# Patient Record
Sex: Female | Born: 1980 | Race: Black or African American | Hispanic: No | Marital: Single | Smoking: Never smoker
Health system: Southern US, Community
[De-identification: ages and names within clinical notes are randomized; demographics above are authoritative.]

---

## 2013-04-07 ENCOUNTER — Encounter (HOSPITAL_COMMUNITY): Payer: Self-pay | Admitting: Emergency Medicine

## 2013-04-07 ENCOUNTER — Emergency Department (HOSPITAL_COMMUNITY): Payer: PRIVATE HEALTH INSURANCE

## 2013-04-07 ENCOUNTER — Emergency Department (HOSPITAL_COMMUNITY)
Admission: EM | Admit: 2013-04-07 | Discharge: 2013-04-08 | Disposition: A | Discharge status: Home / Self Care | Payer: PRIVATE HEALTH INSURANCE | Attending: Emergency Medicine | Admitting: Emergency Medicine

## 2013-04-07 DIAGNOSIS — W108XXA Fall (on) (from) other stairs and steps, initial encounter: Secondary | ICD-10-CM | POA: Insufficient documentation

## 2013-04-07 DIAGNOSIS — Z3202 Encounter for pregnancy test, result negative: Secondary | ICD-10-CM | POA: Insufficient documentation

## 2013-04-07 DIAGNOSIS — Y929 Unspecified place or not applicable: Secondary | ICD-10-CM | POA: Insufficient documentation

## 2013-04-07 DIAGNOSIS — S43016A Anterior dislocation of unspecified humerus, initial encounter: Secondary | ICD-10-CM | POA: Insufficient documentation

## 2013-04-07 DIAGNOSIS — Y9389 Activity, other specified: Secondary | ICD-10-CM | POA: Insufficient documentation

## 2013-04-07 LAB — POCT PREGNANCY, URINE: PREG TEST UR: NEGATIVE

## 2013-04-07 MED ORDER — ONDANSETRON 4 MG PO TBDP
4.0000 mg | ORAL_TABLET | Freq: Once | ORAL | Status: AC
  Administered 2013-04-07: 4 mg via ORAL
  Filled 2013-04-07: qty 1

## 2013-04-07 MED ORDER — DIAZEPAM 5 MG PO TABS
5.0000 mg | ORAL_TABLET | Freq: Once | ORAL | Status: AC
  Administered 2013-04-07: 5 mg via ORAL
  Filled 2013-04-07: qty 1

## 2013-04-07 MED ORDER — FENTANYL CITRATE 0.05 MG/ML IJ SOLN
50.0000 ug | Freq: Once | INTRAMUSCULAR | Status: DC
  Filled 2013-04-07: qty 2

## 2013-04-07 MED ORDER — SODIUM CHLORIDE 0.9 % IV SOLN
Freq: Once | INTRAVENOUS | Status: AC
  Administered 2013-04-07: 125 mL/h via INTRAVENOUS

## 2013-04-07 MED ORDER — ETOMIDATE 2 MG/ML IV SOLN
0.2000 mg/kg | Freq: Once | INTRAVENOUS | Status: AC
  Administered 2013-04-08: 15.6 mg via INTRAVENOUS
  Filled 2013-04-07: qty 10

## 2013-04-07 MED ORDER — OXYCODONE-ACETAMINOPHEN 5-325 MG PO TABS
2.0000 | ORAL_TABLET | Freq: Once | ORAL | Status: AC
  Administered 2013-04-07: 2 via ORAL
  Filled 2013-04-07: qty 2

## 2013-04-07 MED ORDER — ETOMIDATE 2 MG/ML IV SOLN: 0.2500 mg/kg | Freq: Once | INTRAVENOUS | Status: DC

## 2013-04-07 MED ORDER — ONDANSETRON HCL 4 MG/2ML IJ SOLN
4.0000 mg | Freq: Once | INTRAMUSCULAR | Status: DC
  Filled 2013-04-07: qty 2

## 2013-04-07 NOTE — ED Provider Notes (Signed)
CSN: [id number]     Arrival date & time 04/07/13  2154 History   First MD Initiated Contact with Patient 04/07/13 2216     Chief Complaint  Patient presents with  . Shoulder Pain   (Consider location/radiation/quality/duration/timing/severity/associated sxs/prior Treatment) HPI Comments: Patient is a 33 year old female who presents to the emergency apartment with complaint of left shoulder pain. The patient states she was coming down steps this evening just prior to coming to the emergency department, she slipped, fell, and injured the shoulder. She states that since the fall she has not been able to move her shoulder without a great deal of pain. She's not had any previous operations or procedures on the left upper extremity. She denies being on any blood thinning type medications. She denies any other injuries other than the left shoulder.  Patient is a 33 y.o. female presenting with shoulder pain. The history is provided by the patient.  Shoulder Pain This is a new problem. The current episode started today. Pertinent negatives include no abdominal pain, arthralgias, chest pain, coughing or neck pain.    History reviewed. No pertinent past medical history. History reviewed. No pertinent past surgical history. History reviewed. No pertinent family history. History  Substance Use Topics  . Smoking status: Never Smoker   . Smokeless tobacco: Not on file  . Alcohol Use: No   OB History   Grav Para Term Preterm Abortions TAB SAB Ect Mult Living                 Review of Systems  Constitutional: Negative for activity change.       All ROS Neg except as noted in HPI  HENT: Negative for nosebleeds.   Eyes: Negative for photophobia and discharge.  Respiratory: Negative for cough, shortness of breath and wheezing.   Cardiovascular: Negative for chest pain and palpitations.  Gastrointestinal: Negative for abdominal pain and blood in stool.  Genitourinary: Negative for dysuria, frequency  and hematuria.  Musculoskeletal: Negative for arthralgias, back pain and neck pain.  Skin: Negative.   Neurological: Negative for dizziness, seizures and speech difficulty.  Psychiatric/Behavioral: Negative for hallucinations and confusion.    Allergies  Review of patient's allergies indicates no known allergies.  Home Medications  No current outpatient prescriptions on file. BP 124/70  Pulse 86  Temp(Src) 98 F (36.7 C) (Oral)  Resp 18  Ht 5\' 6"  (1.676 m)  Wt 172 lb (78.019 kg)  BMI 27.77 kg/m2  SpO2 100%  LMP 03/24/2013 Physical Exam  Nursing note and vitals reviewed. Constitutional: She is oriented to person, place, and time. She appears well-developed and well-nourished.  Non-toxic appearance.  HENT:  Head: Normocephalic.  Right Ear: Tympanic membrane and external ear normal.  Left Ear: Tympanic membrane and external ear normal.  Eyes: EOM and lids are normal. Pupils are equal, round, and reactive to light.  Neck: Normal range of motion. Neck supple. Carotid bruit is not present.  Cardiovascular: Normal rate, regular rhythm, normal heart sounds, intact distal pulses and normal pulses.   Pulmonary/Chest: Breath sounds normal. No respiratory distress.  No rib or chest wall pain. There is a palpable deformity consistent with the humeral head at the upper left chest just under the clavicle.  Abdominal: Soft. Bowel sounds are normal. There is no tenderness. There is no guarding.  Musculoskeletal: Normal range of motion.  There is full range of motion of the right shoulder, elbow, wrist, fingers.  There is resistance to movement of the left upper extremity.  Particular shoulder area. The patient is holding the shoulder in mild to moderate abduction. On palpation there is loss of the anatomic alignment of the humeral head. The distal pulses are 2+ bilaterally. The capillary refill is less than 2 seconds bilaterally.  Lymphadenopathy:       Head (right side): No submandibular  adenopathy present.       Head (left side): No submandibular adenopathy present.    She has no cervical adenopathy.  Neurological: She is alert and oriented to person, place, and time. She has normal strength. No cranial nerve deficit or sensory deficit.  Skin: Skin is warm and dry.  Psychiatric: She has a normal mood and affect. Her speech is normal.    ED Course  Reduction of dislocation Date/Time: 04/08/2013 12:59 AM Performed by: Kathie DikeBRYANT, Ariaunna Longsworth M Authorized by: Kathie DikeBRYANT, Jionni Helming M Consent: Verbal consent obtained. written consent obtained. Risks and benefits: risks, benefits and alternatives were discussed Consent given by: patient Patient understanding: patient states understanding of the procedure being performed Patient consent: the patient's understanding of the procedure matches consent given Relevant documents: relevant documents present and verified Imaging studies: imaging studies available Patient identity confirmed: arm band Time out: Immediately prior to procedure a "time out" was called to verify the correct patient, procedure, equipment, support staff and site/side marked as required. Patient sedated: yes (Sedation by Dr Patria Maneampos) Patient tolerance: Patient tolerated the procedure well with no immediate complications. Comments: Post reduction films reveal good alignment of the left shoulder.   (including critical care time) Labs Review Labs Reviewed  POCT PREGNANCY, URINE   Imaging Review No results found.  EKG Interpretation   None       MDM Pt sustained a fall down steps tonight and injured the left shoulder. No previous shoulder injuries. No other injuries. No neurovascular compromise. Exam consistent with anterior dislocation. Xray pending to evaluate for fracture.  No diagnosis found. **I have reviewed nursing notes, vital signs, and all appropriate lab and imaging results for this patient.* Xray of the left shoulder reveals anterior dislocation.Pt sent to  the acute area for conscious sedation. Pt seen with me by Dr Patria Maneampos. Patient had conscious sedation by Dr. Patria Maneampos. The shoulder was reduced by me. After the reduction the patient was placed in a shoulder immobilizer, a portable x-ray of the shoulder was obtained, and reveals good postreduction status. Recheck reveals the radial pulses to be 2+, the capillary refill is less than 2 seconds.  The patient will be placed on ibuprofen 800 mg 3 times daily, Norco 5 mg every 4 hours as needed for pain and discomfort. Patient is to followup with Dr. Hilda LiasKeeling if not improving.  Kathie DikeHobson M Thula Stewart, PA-C 04/08/13 0102

## 2013-04-07 NOTE — ED Notes (Signed)
Report given to V. Rice, RN. Pt transferred to room 6.

## 2013-04-07 NOTE — ED Notes (Signed)
Pt reporting pain in left shoulder after falling down stairs and landing on it.  Pt able to move arm, but reporting decreased ROM due to pain.

## 2013-04-07 NOTE — ED Notes (Signed)
Dr. Patria Maneampos at bedside evaluating patient -

## 2013-04-08 ENCOUNTER — Emergency Department (HOSPITAL_COMMUNITY): Payer: PRIVATE HEALTH INSURANCE

## 2013-04-08 MED ORDER — IBUPROFEN 800 MG PO TABS: 800.0000 mg | ORAL_TABLET | Freq: Three times a day (TID) | ORAL | Status: AC

## 2013-04-08 MED ORDER — OXYCODONE-ACETAMINOPHEN 5-325 MG PO TABS: 1.0000 | ORAL_TABLET | Freq: Four times a day (QID) | ORAL | Status: AC | PRN

## 2013-04-08 NOTE — ED Provider Notes (Addendum)
Medical screening examination/treatment/procedure(s) were conducted as a shared visit with non-physician practitioner(s) and myself.  I personally evaluated the patient during the encounter.  EKG Interpretation   None      Left shoulder reduction.  I was personally present and perform the sedation while my physician assistant perform the reduction.  I personally supervised the reduction.  Procedural sedation Performed by: Lyanne CoAMPOS,Triston Skare M Consent: Verbal consent obtained. Risks and benefits: risks, benefits and alternatives were discussed Required items: required blood products, implants, devices, and special equipment available Patient identity confirmed: arm band and provided demographic data Time out: Immediately prior to procedure a "time out" was called to verify the correct patient, procedure, equipment, support staff and site/side marked as required. Sedation type: moderate (conscious) sedation NPO time confirmed and considedered Sedatives: ETOMIDATE Physician Time at Bedside: 15 Vitals: Vital signs were monitored during sedation. Cardiac Monitor, pulse oximeter Patient tolerance: Patient tolerated the procedure well with no immediate complications. Comments: Pt with uneventful recovered. Returned to pre-procedural sedation baseline  SPLINT/Strap APPLICATION Date/Time: 07/09/2012 3:38 PM Authorized by: Lyanne CoAMPOS,Khamari Yousuf M Consent: Verbal consent obtained. Risks and benefits: risks, benefits and alternatives were discussed Consent given by: patient Splint applied by: nursingocation details:  Type:  shoulder immobilizer  Post-procedure: The splinted body part was neurovascularly unchanged following the procedure. Patient tolerance: Patient tolerated the procedure well with no immediate complications.     Lyanne CoKevin M Tevyn Codd, MD 04/08/13 [id number]  Lyanne CoKevin M Jayleah Garbers, MD 04/08/13 [phone]

## 2013-04-08 NOTE — Discharge Instructions (Signed)
Your x-rays revealed a dislocation of your left shoulder. This was reduced without problem. The post reduction films revealed good alignment of your shoulder. Please use the shoulder immobilizer until seen by orthopedics next week. Please call tomorrow for an appointment next week with the orthopedist listed above, or the orthopedist of your choice. Please use ibuprofen 3 times daily with food. May use Percocet for pain if needed, this may cause drowsiness, and/or constipation. Please use with caution. Please return to the emergency department if any changes, problems, or concerns. Shoulder Dislocation Your shoulder is made up of three bones: the collar bone (clavicle); the shoulder blade (scapula), which includes the socket (glenoid cavity); and the upper arm bone (humerus). Your shoulder joint is the place where these bones meet. Strong, fibrous tissues hold these bones together (ligaments). Muscles and strong, fibrous tissues that connect the muscles to these bones (tendons) allow your arm to move through this joint. The range of motion of your shoulder joint is more extensive than most of your other joints, and the glenoid cavity is very shallow. That is the reason that your shoulder joint is one of the most unstable joints in your body. It is far more prone to dislocation than your other joints. Shoulder dislocation is when your humerus is forced out of your shoulder joint. CAUSES Shoulder dislocation is caused by a forceful impact on your shoulder. This impact usually is from an injury, such as a sports injury or a fall. SYMPTOMS Symptoms of shoulder dislocation include:  Deformity of your shoulder.  Intense pain.  Inability to move your shoulder joint.  Numbness, weakness, or tingling around your shoulder joint (your neck or down your arm).  Bruising or swelling around your shoulder. DIAGNOSIS In order to diagnose a dislocated shoulder, your caregiver will perform a physical exam. Your  caregiver also may have an X-ray exam done to see if you have any broken bones. Magnetic resonance imaging (MRI) is a procedure that sometimes is done to help your caregiver see any damage to the soft tissues around your shoulder, particularly your rotator cuff tendons. Additionally, your caregiver also may have electromyography done to measure the electrical discharges produced in your muscles if you have signs or symptoms of nerve damage. TREATMENT A shoulder dislocation is treated by placing the humerus back in the joint (reduction). Your caregiver does this either manually (closed reduction), by moving your humerus back into the joint through manipulation, or through surgery (open reduction). When your humerus is back in place, severe pain should improve almost immediately. You also may need to have surgery if you have a weak shoulder joint or ligaments, and you have recurring shoulder dislocations, despite rehabilitation. In rare cases, surgery is necessary if your nerves or blood vessels are damaged during the dislocation. After your reduction, your arm will be placed in a shoulder immobilizer or sling to keep it from moving. Your caregiver will have you wear your shoulder immoblizer or sling for 3 days to 3 weeks, depending on how serious your dislocation is. When your shoulder immobilizer or sling is removed, your caregiver may prescribe physical therapy to help improve the range of motion in your shoulder joint. HOME CARE INSTRUCTIONS  The following measures can help to reduce pain and speed up the healing process:  Rest your injured joint. Do not move it. Avoid activities similar to the one that caused your injury.  Apply ice to your injured joint for the first day or two after your reduction or as  directed by your caregiver. Applying ice helps to reduce inflammation and pain.  Put ice in a plastic bag.  Place a towel between your skin and the bag.  Leave the ice on for 15-20 minutes at a  time, every 2 hours while you are awake.  Exercise your hand by squeezing a soft ball. This helps to eliminate stiffness and swelling in your hand and wrist.  Take over-the-counter or prescription medicine for pain or discomfort as told by your caregiver. SEEK IMMEDIATE MEDICAL CARE IF:   Your shoulder immobilizer or sling becomes damaged.  Your pain becomes worse rather than better.  You lose feeling in your arm or hand, or they become white and cold. MAKE SURE YOU:   Understand these instructions.  Will watch your condition.  Will get help right away if you are not doing well or get worse. Document Released: 11/12/2000 Document Revised: 05/12/2011 Document Reviewed: 12/08/2010 [facility] Patient Information 2014 [place], [place].

## 2019-02-24 ENCOUNTER — Other Ambulatory Visit: Payer: Self-pay

## 2019-02-24 ENCOUNTER — Emergency Department (HOSPITAL_COMMUNITY): Payer: Medicaid Other

## 2019-02-24 ENCOUNTER — Emergency Department (HOSPITAL_COMMUNITY)
Admission: EM | Admit: 2019-02-24 | Discharge: 2019-02-24 | Disposition: A | Discharge status: Home / Self Care | Payer: Medicaid Other | Attending: Emergency Medicine | Admitting: Emergency Medicine

## 2019-02-24 DIAGNOSIS — R05 Cough: Secondary | ICD-10-CM

## 2019-02-24 DIAGNOSIS — U071 COVID-19: Secondary | ICD-10-CM | POA: Diagnosis not present

## 2019-02-24 DIAGNOSIS — Z79899 Other long term (current) drug therapy: Secondary | ICD-10-CM | POA: Insufficient documentation

## 2019-02-24 DIAGNOSIS — R0602 Shortness of breath: Secondary | ICD-10-CM | POA: Diagnosis present

## 2019-02-24 DIAGNOSIS — R059 Cough, unspecified: Secondary | ICD-10-CM

## 2019-02-24 LAB — CBC WITH DIFFERENTIAL/PLATELET
Abs Immature Granulocytes: 0.02 10*3/uL (ref 0.00–0.07)
Basophils Absolute: 0 10*3/uL (ref 0.0–0.1)
Basophils Relative: 0 %
Eosinophils Absolute: 0 10*3/uL (ref 0.0–0.5)
Eosinophils Relative: 0 %
HCT: 27.6 % — ABNORMAL LOW (ref 36.0–46.0)
Hemoglobin: 7.9 g/dL — ABNORMAL LOW (ref 12.0–15.0)
Immature Granulocytes: 0 %
Lymphocytes Relative: 18 %
Lymphs Abs: 1 10*3/uL (ref 0.7–4.0)
MCH: 21.7 pg — ABNORMAL LOW (ref 26.0–34.0)
MCHC: 28.6 g/dL — ABNORMAL LOW (ref 30.0–36.0)
MCV: 75.8 fL — ABNORMAL LOW (ref 80.0–100.0)
Monocytes Absolute: 0.5 10*3/uL (ref 0.1–1.0)
Monocytes Relative: 9 %
Neutro Abs: 3.9 10*3/uL (ref 1.7–7.7)
Neutrophils Relative %: 73 %
Platelets: 296 10*3/uL (ref 150–400)
RBC: 3.64 MIL/uL — ABNORMAL LOW (ref 3.87–5.11)
RDW: 18.5 % — ABNORMAL HIGH (ref 11.5–15.5)
WBC: 5.4 10*3/uL (ref 4.0–10.5)
nRBC: 0 % (ref 0.0–0.2)

## 2019-02-24 LAB — COMPREHENSIVE METABOLIC PANEL
ALT: 14 U/L (ref 0–44)
AST: 17 U/L (ref 15–41)
Albumin: 3.6 g/dL (ref 3.5–5.0)
Alkaline Phosphatase: 35 U/L — ABNORMAL LOW (ref 38–126)
Anion gap: 7 (ref 5–15)
BUN: 6 mg/dL (ref 6–20)
CO2: 28 mmol/L (ref 22–32)
Calcium: 8.2 mg/dL — ABNORMAL LOW (ref 8.9–10.3)
Chloride: 107 mmol/L (ref 98–111)
Creatinine, Ser: 0.98 mg/dL (ref 0.44–1.00)
GFR calc Af Amer: >60 mL/min (ref >60)
GFR calc non Af Amer: >60 mL/min (ref >60)
Glucose, Bld: 113 mg/dL — ABNORMAL HIGH (ref 70–99)
Potassium: 3.7 mmol/L (ref 3.5–5.1)
Sodium: 142 mmol/L (ref 135–145)
Total Bilirubin: 0.5 mg/dL (ref 0.3–1.2)
Total Protein: 7.1 g/dL (ref 6.5–8.1)

## 2019-02-24 MED ORDER — ALBUTEROL SULFATE HFA 108 (90 BASE) MCG/ACT IN AERS: 1.0000 | INHALATION_SPRAY | Freq: Four times a day (QID) | RESPIRATORY_TRACT | 1 refills | Status: AC | PRN

## 2019-02-24 MED ORDER — DOXYCYCLINE HYCLATE 100 MG PO CAPS: 100.0000 mg | ORAL_CAPSULE | Freq: Two times a day (BID) | ORAL | 0 refills | Status: AC

## 2019-02-24 MED ORDER — SODIUM CHLORIDE 0.9 % IV BOLUS
1000.0000 mL | Freq: Once | INTRAVENOUS | Status: AC
  Administered 2019-02-24: 1000 mL via INTRAVENOUS

## 2019-02-24 NOTE — ED Provider Notes (Signed)
[facility]-EMERGENCY DEPT Provider Note   CSN: [id number] Arrival date & time: 02/24/19  1339     History Chief Complaint  Patient presents with  . COVID+  . Shortness of Breath    Amy Ho is a 38 y.o. female.  Patient has positive Covid and complains of shortness of breath  The history is provided by the patient. No language interpreter was used.  Shortness of Breath Severity:  Mild Onset quality:  Sudden Timing:  Intermittent Progression:  Waxing and waning Chronicity:  New Context: activity   Relieved by:  Nothing Worsened by:  Nothing Ineffective treatments:  None tried Associated symptoms: no abdominal pain, no chest pain, no cough, no headaches and no rash        No past medical history on file.  There are no problems to display for this patient.   No past surgical history on file.   OB History   No obstetric history on file.     No family history on file.  Social History   Tobacco Use  . Smoking status: Never Smoker  Substance Use Topics  . Alcohol use: No  . Drug use: No    Home Medications Prior to Admission medications   Medication Sig Start Date End Date Taking? Authorizing Provider  acetaminophen (TYLENOL) 325 MG tablet Take 650 mg by mouth every 6 (six) hours as needed for mild pain, fever or headache.   Yes [provider]  Chlorphen-PE-Acetaminophen (CORICIDIN D COLD/FLU/SINUS PO) Take 1 tablet by mouth 2 (two) times daily.   Yes [provider]  fluticasone (FLONASE) 50 MCG/ACT nasal spray Place 2 sprays into both nostrils daily as needed for allergies or rhinitis.   Yes [provider]  albuterol (VENTOLIN HFA) 108 (90 Base) MCG/ACT inhaler Inhale 1-2 puffs into the lungs every 6 (six) hours as needed for wheezing or shortness of breath. 02/24/19   Bethann Berkshire, MD  doxycycline (VIBRAMYCIN) 100 MG capsule Take 1 capsule (100 mg total) by mouth 2 (two) times daily. One po bid x  7 days 02/24/19   Bethann Berkshire, MD  ibuprofen (ADVIL,MOTRIN) 800 MG tablet Take 1 tablet (800 mg total) by mouth 3 (three) times daily. Patient not taking: Reported on 02/24/2019 04/08/13   Ivery Quale, PA-C  oxyCODONE-acetaminophen (PERCOCET/ROXICET) 5-325 MG per tablet Take 1 tablet by mouth every 6 (six) hours as needed for severe pain. Patient not taking: Reported on 02/24/2019 04/08/13   Ivery Quale, PA-C    Allergies    Patient has no known allergies.  Review of Systems   Review of Systems  Constitutional: Negative for appetite change and fatigue.  HENT: Negative for congestion, ear discharge and sinus pressure.   Eyes: Negative for discharge.  Respiratory: Positive for shortness of breath. Negative for cough.   Cardiovascular: Negative for chest pain.  Gastrointestinal: Negative for abdominal pain and diarrhea.  Genitourinary: Negative for frequency and hematuria.  Musculoskeletal: Negative for back pain.  Skin: Negative for rash.  Neurological: Negative for seizures and headaches.  Psychiatric/Behavioral: Negative for hallucinations.    Physical Exam Updated Vital Signs BP 126/73   Pulse 91   Temp (!) 100.9 F (38.3 C) (Oral)   Resp 18   SpO2 98%   Physical Exam Vitals and nursing note reviewed.  Constitutional:      Appearance: She is well-developed.  HENT:     Head: Normocephalic.     Nose: Nose normal.  Eyes:     General: No  scleral icterus.    Conjunctiva/sclera: Conjunctivae normal.  Neck:     Thyroid: No thyromegaly.  Cardiovascular:     Rate and Rhythm: Normal rate and regular rhythm.     Heart sounds: No murmur. No friction rub. No gallop.   Pulmonary:     Breath sounds: No stridor. No wheezing or rales.  Chest:     Chest wall: No tenderness.  Abdominal:     General: There is no distension.     Tenderness: There is no abdominal tenderness. There is no rebound.  Musculoskeletal:        General: Normal range of motion.     Cervical back:  Neck supple.  Lymphadenopathy:     Cervical: No cervical adenopathy.  Skin:    Findings: No erythema or rash.  Neurological:     Mental Status: She is oriented to person, place, and time.     Motor: No abnormal muscle tone.     Coordination: Coordination normal.  Psychiatric:        Behavior: Behavior normal.     ED Results / Procedures / Treatments   Labs (all labs ordered are listed, but only abnormal results are displayed) Labs Reviewed  CBC WITH DIFFERENTIAL/PLATELET - Abnormal; Notable for the following components:      Result Value   RBC 3.64 (*)    Hemoglobin 7.9 (*)    HCT 27.6 (*)    MCV 75.8 (*)    MCH 21.7 (*)    MCHC 28.6 (*)    RDW 18.5 (*)    All other components within normal limits  COMPREHENSIVE METABOLIC PANEL - Abnormal; Notable for the following components:   Glucose, Bld 113 (*)    Calcium 8.2 (*)    Alkaline Phosphatase 35 (*)    All other components within normal limits    EKG None  Radiology DG Chest Port 1 View  Result Date: 02/24/2019 CLINICAL DATA:  COVID-19 positive. Cough and increasing shortness of breath. EXAM: PORTABLE CHEST 1 VIEW COMPARISON:  None. FINDINGS: The heart size is normal. Minimal airspace opacities are present at the bases, right greater than left. Upper lung fields are clear. The visualized soft tissues and bony thorax are unremarkable. IMPRESSION: 1. Minimal bibasilar airspace disease, right greater than left. The setting of known COVID 19 infection, this is concerning for early consolidation and airspace disease. Electronically Signed   By: San Morelle M.D.   On: 02/24/2019 14:44    Procedures Procedures (including critical care time)  Medications Ordered in ED Medications  sodium chloride 0.9 % bolus 1,000 mL (1,000 mLs Intravenous New Bag/Given (Non-Interop) 02/24/19 1506)    ED Course  I have reviewed the triage vital signs and the nursing notes.  Pertinent labs & imaging results that were available  during my care of the patient were reviewed by me and considered in my medical decision making (see chart for details). Amy Ho was evaluated in Emergency Department on 02/24/2019 for the symptoms described in the history of present illness. She was evaluated in the context of the global COVID-19 pandemic, which necessitated consideration that the patient might be at risk for infection with the SARS-CoV-2 virus that causes COVID-19. Institutional protocols and algorithms that pertain to the evaluation of patients at risk for COVID-19 are in a state of rapid change based on information released by regulatory bodies including the CDC and federal and state organizations. These policies and algorithms were followed during the patient's care in the ED.  MDM Rules/Calculators/A&P                      Patient with a history of anemia.  Hemoglobin 7.9.  Patient also has COVID-19 and chest x-ray suggesting possible pneumonia.  She is nontoxic and not hypoxic.  She is sent home on doxycycline and albuterol and will follow-up as needed Final Clinical Impression(s) / ED Diagnoses Final diagnoses:  COVID-19    Rx / DC Orders ED Discharge Orders         Ordered    doxycycline (VIBRAMYCIN) 100 MG capsule  2 times daily     02/24/19 1635    albuterol (VENTOLIN HFA) 108 (90 Base) MCG/ACT inhaler  Every 6 hours PRN     02/24/19 1635           Bethann BerkshireZammit, Pasqual Farias, MD 02/24/19 1642

## 2019-02-24 NOTE — ED Triage Notes (Signed)
Pt was dx with COVID on Tuesday. Pt states that she has woken up in her sleep SHOB. Pt states she has also had a cough. NAD, speaking in full sentences.

## 2019-02-24 NOTE — Discharge Instructions (Addendum)
Take Tylenol Motrin for fevers and aches and follow-up with your doctor next week.  Return sooner if problems

## 2020-01-11 ENCOUNTER — Encounter (HOSPITAL_COMMUNITY): Payer: Self-pay

## 2020-01-11 ENCOUNTER — Emergency Department (HOSPITAL_COMMUNITY)
Admission: EM | Admit: 2020-01-11 | Discharge: 2020-01-11 | Disposition: A | Discharge status: Home / Self Care | Payer: Medicaid Other | Attending: Emergency Medicine | Admitting: Emergency Medicine

## 2020-01-11 ENCOUNTER — Emergency Department (HOSPITAL_COMMUNITY): Payer: Medicaid Other

## 2020-01-11 DIAGNOSIS — R799 Abnormal finding of blood chemistry, unspecified: Secondary | ICD-10-CM | POA: Diagnosis present

## 2020-01-11 DIAGNOSIS — D509 Iron deficiency anemia, unspecified: Secondary | ICD-10-CM

## 2020-01-11 DIAGNOSIS — N939 Abnormal uterine and vaginal bleeding, unspecified: Secondary | ICD-10-CM

## 2020-01-11 LAB — BASIC METABOLIC PANEL
Anion gap: 9 (ref 5–15)
BUN: 11 mg/dL (ref 6–20)
CO2: 25 mmol/L (ref 22–32)
Calcium: 8.4 mg/dL — ABNORMAL LOW (ref 8.9–10.3)
Chloride: 106 mmol/L (ref 98–111)
Creatinine, Ser: 0.83 mg/dL (ref 0.44–1.00)
GFR, Estimated: >60 mL/min (ref >60)
Glucose, Bld: 115 mg/dL — ABNORMAL HIGH (ref 70–99)
Potassium: 3.8 mmol/L (ref 3.5–5.1)
Sodium: 140 mmol/L (ref 135–145)

## 2020-01-11 LAB — CBC WITH DIFFERENTIAL/PLATELET
Abs Immature Granulocytes: 0.04 10*3/uL (ref 0.00–0.07)
Basophils Absolute: 0.1 10*3/uL (ref 0.0–0.1)
Basophils Relative: 1 %
Eosinophils Absolute: 0.2 10*3/uL (ref 0.0–0.5)
Eosinophils Relative: 2 %
HCT: 24 % — ABNORMAL LOW (ref 36.0–46.0)
Hemoglobin: 7 g/dL — ABNORMAL LOW (ref 12.0–15.0)
Immature Granulocytes: 0 %
Lymphocytes Relative: 37 %
Lymphs Abs: 3.3 10*3/uL (ref 0.7–4.0)
MCH: 22.7 pg — ABNORMAL LOW (ref 26.0–34.0)
MCHC: 29.2 g/dL — ABNORMAL LOW (ref 30.0–36.0)
MCV: 77.7 fL — ABNORMAL LOW (ref 80.0–100.0)
Monocytes Absolute: 1 10*3/uL (ref 0.1–1.0)
Monocytes Relative: 11 %
Neutro Abs: 4.5 10*3/uL (ref 1.7–7.7)
Neutrophils Relative %: 49 %
Platelets: 569 10*3/uL — ABNORMAL HIGH (ref 150–400)
RBC: 3.09 MIL/uL — ABNORMAL LOW (ref 3.87–5.11)
RDW: 17.6 % — ABNORMAL HIGH (ref 11.5–15.5)
WBC: 9.1 10*3/uL (ref 4.0–10.5)
nRBC: 0.6 % — ABNORMAL HIGH (ref 0.0–0.2)

## 2020-01-11 LAB — PREPARE RBC (CROSSMATCH)

## 2020-01-11 LAB — TYPE AND SCREEN
ABO/RH(D): O POS
Antibody Screen: NEGATIVE

## 2020-01-11 LAB — PROTIME-INR
INR: 1.3 — ABNORMAL HIGH (ref 0.8–1.2)
Prothrombin Time: 15.4 seconds — ABNORMAL HIGH (ref 11.4–15.2)

## 2020-01-11 MED ORDER — SODIUM CHLORIDE 0.9 % IV SOLN: 100.0000 mg | Freq: Once | INTRAVENOUS | Status: DC

## 2020-01-11 MED ORDER — MEGESTROL ACETATE 40 MG PO TABS
40.0000 mg | ORAL_TABLET | Freq: Two times a day (BID) | ORAL | Status: DC
  Administered 2020-01-11: 40 mg via ORAL
  Filled 2020-01-11: qty 1

## 2020-01-11 MED ORDER — SODIUM CHLORIDE 0.9 % IV SOLN
510.0000 mg | Freq: Once | INTRAVENOUS | Status: AC
  Administered 2020-01-11: 510 mg via INTRAVENOUS
  Filled 2020-01-11: qty 510

## 2020-01-11 MED ORDER — MEGESTROL ACETATE 40 MG PO TABS: 40.0000 mg | ORAL_TABLET | Freq: Two times a day (BID) | ORAL | 0 refills | Status: AC

## 2020-01-11 MED ORDER — ACETAMINOPHEN 325 MG PO TABS
650.0000 mg | ORAL_TABLET | Freq: Once | ORAL | Status: AC
  Administered 2020-01-11: 650 mg via ORAL
  Filled 2020-01-11: qty 2

## 2020-01-11 MED ORDER — SODIUM CHLORIDE 0.9 % IV SOLN
10.0000 mL/h | Freq: Once | INTRAVENOUS | Status: AC
  Administered 2020-01-11: 10 mL/h via INTRAVENOUS

## 2020-01-11 NOTE — ED Provider Notes (Signed)
[facility]-EMERGENCY DEPT Provider Note   CSN: [id number] Arrival date & time: 01/11/20  0865     History Chief Complaint  Patient presents with  . Abnormal Lab  . Fatigue  . Vaginal Bleeding    Amy Ho is a 39 y.o. female.  Presents to ER with concern for low hemoglobin and low iron.  Reports that she has a long history of abnormal vaginal bleeding.  Normally has irregular heavy periods once every month or so.  She has been bleeding for the past couple weeks, over the past couple days it has been lighter.  Previously it was heavier than normal.  She chronically feels somewhat short of breath, fatigued.  Seems to be worsened over the past couple weeks.  No symptoms at rest currently.  No difficulty in breathing, chest pain, syncope.  Completed chart review, recent visit with PCP, long history of abnormal uterine bleeding, not currently on any medication for this.  Hemoglobin low, iron low, started on iron supplementation and referred to gynecology in the [facility] system.  Patient has not yet been seen by gynecology.  HPI     History reviewed. No pertinent past medical history.  There are no problems to display for this patient.   History reviewed. No pertinent surgical history.   OB History   No obstetric history on file.     No family history on file.  Social History   Tobacco Use  . Smoking status: Never Smoker  Substance Use Topics  . Alcohol use: No  . Drug use: No    Home Medications Prior to Admission medications   Medication Sig Start Date End Date Taking? Authorizing Provider  acetaminophen (TYLENOL) 325 MG tablet Take 650 mg by mouth every 6 (six) hours as needed for mild pain, fever or headache.   Yes [provider]  albuterol (VENTOLIN HFA) 108 (90 Base) MCG/ACT inhaler Inhale 1-2 puffs into the lungs every 6 (six) hours as needed for wheezing or shortness of breath. 02/24/19  Yes Bethann Berkshire, MD  Prenatal Vit-Fe  Fumarate-FA (PRENATAL MULTIVITAMIN) TABS tablet Take 1 tablet by mouth daily at 12 noon.   Yes [provider]  doxycycline (VIBRAMYCIN) 100 MG capsule Take 1 capsule (100 mg total) by mouth 2 (two) times daily. One po bid x 7 days Patient not taking: Reported on 01/11/2020 02/24/19   Bethann Berkshire, MD  ibuprofen (ADVIL,MOTRIN) 800 MG tablet Take 1 tablet (800 mg total) by mouth 3 (three) times daily. Patient not taking: Reported on 02/24/2019 04/08/13   Ivery Quale, PA-C  oxyCODONE-acetaminophen (PERCOCET/ROXICET) 5-325 MG per tablet Take 1 tablet by mouth every 6 (six) hours as needed for severe pain. Patient not taking: Reported on 02/24/2019 04/08/13   Ivery Quale, PA-C    Allergies    Patient has no known allergies.  Review of Systems   Review of Systems  Constitutional: Positive for fatigue. Negative for chills and fever.  HENT: Negative for ear pain and sore throat.   Eyes: Negative for pain and visual disturbance.  Respiratory: Positive for shortness of breath. Negative for cough.   Cardiovascular: Negative for chest pain and palpitations.  Gastrointestinal: Negative for abdominal pain and vomiting.  Genitourinary: Positive for vaginal bleeding. Negative for dysuria and hematuria.  Musculoskeletal: Negative for arthralgias and back pain.  Skin: Negative for color change and rash.  Neurological: Negative for seizures and syncope.  All other systems reviewed and are negative.   Physical Exam Updated Vital Signs BP  106/60   Pulse 79   Temp 98.9 F (37.2 C)   Resp (!) 26   LMP 12/02/2019   SpO2 100%   Physical Exam Vitals and nursing note reviewed.  Constitutional:      General: She is not in acute distress.    Appearance: She is well-developed.  HENT:     Head: Normocephalic and atraumatic.  Eyes:     Conjunctiva/sclera: Conjunctivae normal.  Cardiovascular:     Rate and Rhythm: Normal rate and regular rhythm.     Heart sounds: No murmur heard.    Pulmonary:     Effort: Pulmonary effort is normal. No respiratory distress.     Breath sounds: Normal breath sounds.  Abdominal:     Palpations: Abdomen is soft.     Tenderness: There is no abdominal tenderness.  Musculoskeletal:        General: No deformity or signs of injury.     Cervical back: Neck supple.  Skin:    General: Skin is warm and dry.     Capillary Refill: Capillary refill takes less than 2 seconds.  Neurological:     General: No focal deficit present.     Mental Status: She is alert and oriented to person, place, and time.  Psychiatric:        Mood and Affect: Mood normal.        Behavior: Behavior normal.     ED Results / Procedures / Treatments   Labs (all labs ordered are listed, but only abnormal results are displayed) Labs Reviewed  CBC WITH DIFFERENTIAL/PLATELET - Abnormal; Notable for the following components:      Result Value   RBC 3.09 (*)    Hemoglobin 7.0 (*)    HCT 24.0 (*)    MCV 77.7 (*)    MCH 22.7 (*)    MCHC 29.2 (*)    RDW 17.6 (*)    Platelets 569 (*)    nRBC 0.6 (*)    All other components within normal limits  BASIC METABOLIC PANEL - Abnormal; Notable for the following components:   Glucose, Bld 115 (*)    Calcium 8.4 (*)    All other components within normal limits  PROTIME-INR - Abnormal; Notable for the following components:   Prothrombin Time 15.4 (*)    INR 1.3 (*)    All other components within normal limits  TYPE AND SCREEN  PREPARE RBC (CROSSMATCH)  ABO/RH    EKG EKG Interpretation  Date/Time:  Wednesday January 11 2020 11:18:09 EST Ventricular Rate:  67 PR Interval:    QRS Duration: 98 QT Interval:  397 QTC Calculation: 420 R Axis:   25 Text Interpretation: Sinus rhythm Borderline T abnormalities, inferior leads Confirmed by Marianna Fuss ([id number]) on 01/11/2020 2:07:24 PM   Radiology DG Chest 2 View  Result Date: 01/11/2020 CLINICAL DATA:  Shortness of breath. EXAM: CHEST - 2 VIEW COMPARISON:   02/24/2019. FINDINGS: The heart size and mediastinal contours are within normal limits. Both lungs are clear. No visible pleural effusions or pneumothorax. The visualized skeletal structures are unremarkable. IMPRESSION: No active cardiopulmonary disease. Electronically Signed   By: Feliberto Harts MD   On: 01/11/2020 13:19    Procedures Procedures (including critical care time)  Medications Ordered in ED Medications  megestrol (MEGACE) tablet 40 mg (has no administration in time range)  ferumoxytol (FERAHEME) 510 mg in sodium chloride 0.9 % 100 mL IVPB (has no administration in time range)  acetaminophen (TYLENOL) tablet 650 mg (  650 mg Oral Given 01/11/20 1056)  0.9 %  sodium chloride infusion (10 mL/hr Intravenous New Bag/Given 01/11/20 1233)    ED Course  I have reviewed the triage vital signs and the nursing notes.  Pertinent labs & imaging results that were available during my care of the patient were reviewed by me and considered in my medical decision making (see chart for details).    MDM Rules/Calculators/A&P                         39 year old lady presents to the ER with concern for worsening vaginal bleeding.  Prior pelvic ultrasound last year was reportedly normal.  On exam noted to have no abdominal tenderness, pelvic normal except for blood in vaginal vault.  Hemoglobin down to 7, PCP checked was at 7.5.  Due to symptoms, hemoglobin of 7, recommended blood transfusion.  After discussing my recommendation, risk and benefits, patient has declined blood transfusion and wishes to pursue alternative therapy.  Reviewed case with on-call gynecology, Dr. Alysia Penna, recommends iron infusion and Megace, follow-up in clinic with him.  Ordered iron infusion, provided Rx for Megace.  Patient remains well-appearing with stable vital signs.  Will DC home after completing iron infusion.   After the discussed management above, the patient was determined to be safe for discharge.  The patient was  in agreement with this plan and all questions regarding their care were answered.  ED return precautions were discussed and the patient will return to the ED with any significant worsening of condition.   Final Clinical Impression(s) / ED Diagnoses Final diagnoses:  Abnormal uterine bleeding  Iron deficiency anemia, unspecified iron deficiency anemia type     Milagros Loll, MD 01/11/20 1409

## 2020-01-11 NOTE — ED Triage Notes (Signed)
Per patient she was seen at PCP for ongoing menstrual period. Patient called this morning for abnormal labs.   Ferritin-5 Hemoglobin-7.5  Patient reports fatigue when climbing steps.   A/ox4 Ambulatory in triage   Hx: iron deficiency anemia   Pt denies hx of blood transfusion

## 2020-01-11 NOTE — Discharge Instructions (Signed)
Please call the gynecology office to schedule a close follow-up appointment.  If you have worsening shortness of breath, lightheadedness, any episodes of passing out, worsening bleeding or other new concerning symptom, return to ER for reassessment.

## 2020-01-12 ENCOUNTER — Telehealth (INDEPENDENT_AMBULATORY_CARE_PROVIDER_SITE_OTHER): Payer: Self-pay | Admitting: Internal Medicine

## 2020-01-12 NOTE — Telephone Encounter (Signed)
Called pt 3xs. No answer. Attempted to sched. Hosp F/u appt.

## 2020-02-06 ENCOUNTER — Ambulatory Visit (INDEPENDENT_AMBULATORY_CARE_PROVIDER_SITE_OTHER): Payer: Medicaid Other | Admitting: Obstetrics & Gynecology

## 2020-02-06 ENCOUNTER — Other Ambulatory Visit (HOSPITAL_COMMUNITY)
Admission: RE | Admit: 2020-02-06 | Discharge: 2020-02-06 | Disposition: A | Discharge status: Home / Self Care | Payer: Medicaid Other | Source: Ambulatory Visit | Attending: Obstetrics & Gynecology | Admitting: Obstetrics & Gynecology

## 2020-02-06 ENCOUNTER — Other Ambulatory Visit: Payer: Self-pay

## 2020-02-06 ENCOUNTER — Encounter: Payer: Self-pay | Admitting: *Deleted

## 2020-02-06 VITALS — BP 121/73 | HR 91 | Ht 67.0 in | Wt 218.5 lb

## 2020-02-06 DIAGNOSIS — Z113 Encounter for screening for infections with a predominantly sexual mode of transmission: Secondary | ICD-10-CM | POA: Diagnosis present

## 2020-02-06 DIAGNOSIS — Z3202 Encounter for pregnancy test, result negative: Secondary | ICD-10-CM

## 2020-02-06 DIAGNOSIS — Z01419 Encounter for gynecological examination (general) (routine) without abnormal findings: Secondary | ICD-10-CM

## 2020-02-06 DIAGNOSIS — N939 Abnormal uterine and vaginal bleeding, unspecified: Secondary | ICD-10-CM | POA: Diagnosis present

## 2020-02-06 LAB — POCT PREGNANCY, URINE: Preg Test, Ur: NEGATIVE

## 2020-02-06 NOTE — Progress Notes (Signed)
Amy Ho is an 39 y.o. female. Who presents from recent ER visit with heavy vaginal bleeding. Pt states 8-10 pain/frontal, tylenol does not help. Of note pt has near oligo AFI for the past 3 weeks, She is a chronic hypertension pattent.    Pertinent Gynecological History: Menses: flow is excessive with use of 8-10 pads or tampons on heaviest days and 4 on lightest Bleeding: normal Contraception: none DES exposure: denies OB History: G1, P1001 SVD   Menstrual History: Menarche age: 39yo  No LMP recorded. (Menstrual status: Irregular Periods).    No past medical history on file.  No past surgical history on file.  No family history on file.  Social History:  reports that she has never smoked. She does not have any smokeless tobacco history on file. She reports that she does not drink alcohol and does not use drugs.  Allergies: No Known Allergies  (Not in a hospital admission)   Review of Systems  Constitutional: Negative for activity change, appetite change, fatigue and unexpected weight change.  HENT: Negative.   Eyes: Negative.   Respiratory: Negative.  Negative for chest tightness, shortness of breath and wheezing.   Cardiovascular: Negative.  Negative for chest pain and leg swelling.  Gastrointestinal: Negative.  Negative for abdominal distention, abdominal pain, constipation, diarrhea, nausea and vomiting.  Endocrine: Negative.   Genitourinary: Positive for vaginal pain. Negative for dysuria and hematuria.  Neurological: Negative.  Negative for dizziness, weakness, light-headedness, numbness and headaches.  Hematological: Negative.   Psychiatric/Behavioral: Negative.  Negative for agitation, confusion and decreased concentration.    Blood pressure 121/73, pulse 91, height 5\' 7"  (1.702 m), weight 218 lb 8 oz (99.1 kg). Physical Exam Vitals reviewed.  Constitutional:      Appearance: She is well-developed.  HENT:     Head: Normocephalic and atraumatic.  Eyes:      Pupils: Pupils are equal, round, and reactive to light.  Cardiovascular:     Rate and Rhythm: Normal rate.  Pulmonary:     Effort: Pulmonary effort is normal.  Abdominal:     General: There is no distension.     Palpations: Abdomen is soft.     Tenderness: There is no abdominal tenderness. There is no guarding.  Musculoskeletal:     Cervical back: Normal range of motion.  Skin:    General: Skin is warm and dry.  Neurological:     Mental Status: She is alert and oriented to person, place, and time.  Psychiatric:        Behavior: Behavior normal.     Results for orders placed or performed in visit on 02/06/20 (from the past 24 hour(s))  Pregnancy, urine POC     Status: None   Collection Time: 02/06/20 10:52 AM  Result Value Ref Range   Preg Test, Ur NEGATIVE NEGATIVE    No results found.  ENDOMETRIAL BIOPSY     The indications for endometrial biopsy were reviewed.   Risks of the biopsy including cramping, bleeding, infection, uterine perforation, inadequate specimen and need for additional procedures  were discussed. The patient states she understands and agrees to undergo procedure today. Consent was signed. Time out was performed. Urine HCG was negative. During the pelvic exam, the cervix was prepped with Betadine. A single-toothed tenaculum was placed on the anterior lip of the cervix to stabilize it. The 3 mm pipelle was introduced into the endometrial cavity without difficulty to a depth of 8.5cm, and a moderate amount of tissue was obtained and  sent to pathology. The instruments were removed from the patient's vagina. Minimal bleeding from the cervix was noted. The patient tolerated the procedure well. Routine post-procedure instructions were given to the patient.     Assessment/Plan: Menorrhagia: Colpo/EMB done to assess for polyp/malignancy. Will relay results, Precautions given. Follow up as scheduled, prn concerns.   Malachy Chamber 02/06/2020, 12:02 PM

## 2020-02-07 LAB — HEPATITIS C ANTIBODY: Hep C Virus Ab: <0.1 s/co ratio (ref 0.0–0.9)

## 2020-02-07 LAB — HEPATITIS B SURFACE ANTIGEN: Hepatitis B Surface Ag: NEGATIVE

## 2020-02-07 LAB — HIV ANTIBODY (ROUTINE TESTING W REFLEX): HIV Screen 4th Generation wRfx: NONREACTIVE

## 2020-02-07 LAB — RPR: RPR Ser Ql: NONREACTIVE

## 2020-02-08 LAB — SURGICAL PATHOLOGY

## 2020-02-10 LAB — CYTOLOGY - PAP
Adequacy: ABNORMAL
Chlamydia: NEGATIVE
Comment: NEGATIVE
Comment: NEGATIVE
Comment: NEGATIVE
Comment: NORMAL
Neisseria Gonorrhea: NEGATIVE
Trichomonas: NEGATIVE

## 2020-02-21 ENCOUNTER — Telehealth: Payer: Self-pay

## 2020-02-21 NOTE — Telephone Encounter (Signed)
Called Pt to advise that we need to repeat her PAP, insufficient sample, no answer, left VM for call back.

## 2020-02-21 NOTE — Telephone Encounter (Signed)
-----   Message from Kathee Delton, RN sent at 02/21/2020  9:39 AM EST ----- Patient needs repeat pap smear- initial sample was unsatisfactory.

## 2020-02-28 ENCOUNTER — Ambulatory Visit: Admission: RE | Admit: 2020-02-28 | Payer: PRIVATE HEALTH INSURANCE | Source: Ambulatory Visit

## 2020-02-28 ENCOUNTER — Telehealth: Payer: Self-pay | Admitting: Lactation Services

## 2020-02-28 NOTE — Telephone Encounter (Signed)
Patient called about results from Endometrial biopsy. She is asking for results to be uploaded to My Chart.   Patient informed that her Endometrial Biopsy showed no malignancy.   She was informed that her Pap Smear was inadequate and needs to be repeated. She would like to follow up with her PCP at [facility] for a follow up Pap Smear.   Patient reports she forgot to come to her [place] appt today, she plans to follow up with her PCP in regards to that also.   She has no further questions or concerns at this time.

## 2022-09-09 IMAGING — CR DG CHEST 2V
2 series · 2 of 2 positions shown · non-contrast
Comparison: 02/24/2019.

CLINICAL DATA: Shortness of breath.

EXAM:
CHEST - 2 VIEW

[w chest pa]
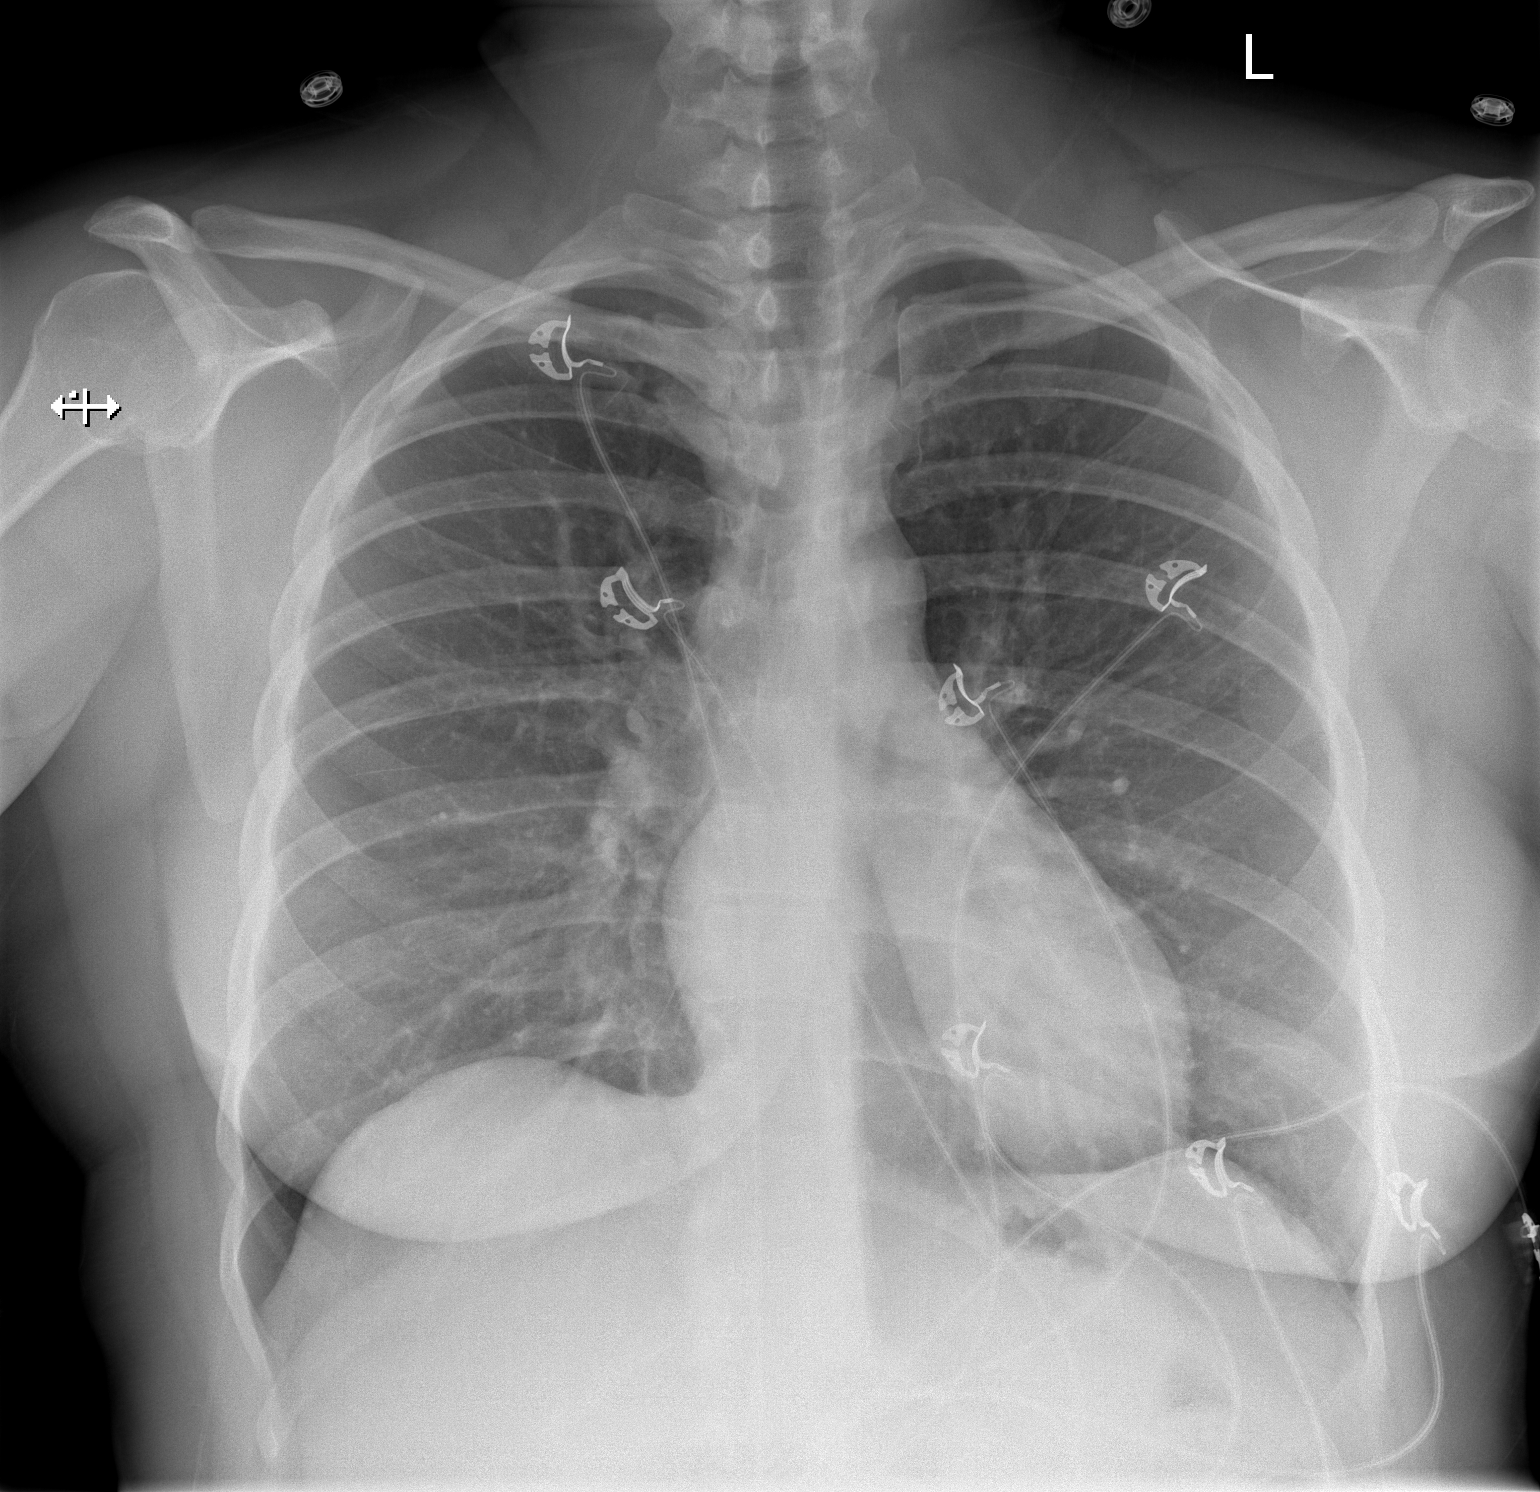

[w chest lat]
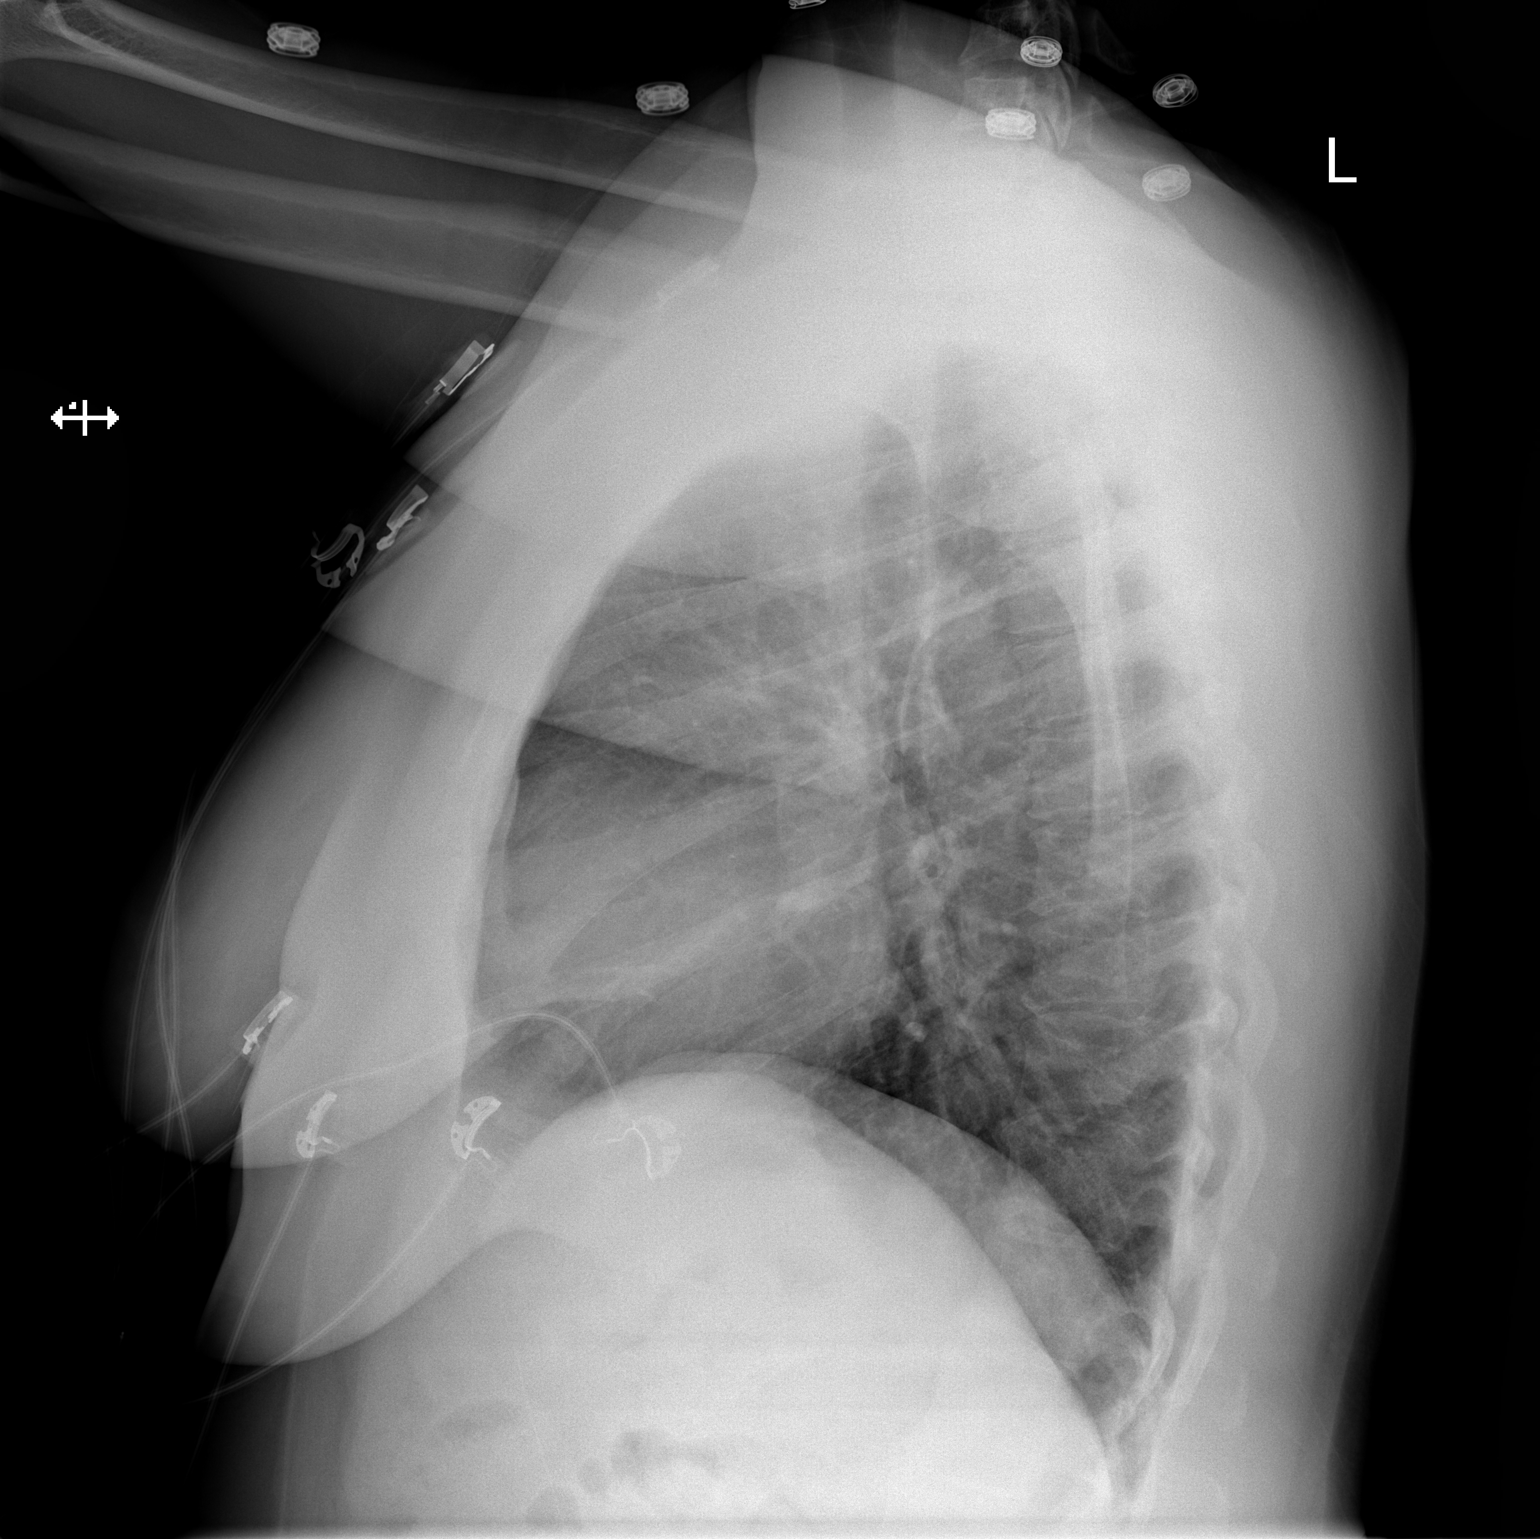

[2 of 2 positions shown; findings below may reference images not displayed]

FINDINGS: The heart size and mediastinal contours are within normal limits.
Both lungs are clear. No visible pleural effusions or pneumothorax.
The visualized skeletal structures are unremarkable.
IMPRESSION: No active cardiopulmonary disease.
